# Patient Record
Sex: Male | Born: 1955 | Race: Black or African American | Hispanic: No | Marital: Single | Smoking: Former smoker
Health system: Southern US, Community
[De-identification: ages and names within clinical notes are randomized; demographics above are authoritative.]

## PROBLEM LIST (undated history)

## (undated) DIAGNOSIS — Z789 Other specified health status: Secondary | ICD-10-CM

## (undated) HISTORY — DX: Other specified health status: Z78.9

## (undated) HISTORY — PX: SHOULDER ARTHROSCOPY: SHX128

## (undated) HISTORY — PX: APPENDECTOMY: SHX54

## (undated) HISTORY — PX: KNEE ARTHROSCOPY: SUR90

---

## 2016-02-04 ENCOUNTER — Ambulatory Visit (INDEPENDENT_AMBULATORY_CARE_PROVIDER_SITE_OTHER): Payer: BLUE CROSS/BLUE SHIELD | Admitting: Physician Assistant

## 2016-02-04 VITALS — BP 152/88 | HR 71 | Temp 99.6°F | Resp 17 | Ht 77.5 in | Wt 260.0 lb

## 2016-02-04 DIAGNOSIS — D72828 Other elevated white blood cell count: Secondary | ICD-10-CM | POA: Diagnosis not present

## 2016-02-04 DIAGNOSIS — J029 Acute pharyngitis, unspecified: Secondary | ICD-10-CM | POA: Diagnosis not present

## 2016-02-04 LAB — POCT CBC
GRANULOCYTE PERCENT: 79.2 % (ref 37–80)
HCT, POC: 38.5 % — AB (ref 43.5–53.7)
HEMOGLOBIN: 13.2 g/dL — AB (ref 14.1–18.1)
LYMPH, POC: 1.5 (ref 0.6–3.4)
MCH, POC: 31.1 pg (ref 27–31.2)
MCHC: 34.3 g/dL (ref 31.8–35.4)
MCV: 90.6 fL (ref 80–97)
MID (cbc): 0.5 (ref 0–0.9)
MPV: 7.2 fL (ref 0–99.8)
POC Granulocyte: 7.8 — AB (ref 2–6.9)
POC LYMPH %: 15.6 % (ref 10–50)
POC MID %: 5.2 %M (ref 0–12)
Platelet Count, POC: 190 10*3/uL (ref 142–424)
RBC: 4.25 M/uL — AB (ref 4.69–6.13)
RDW, POC: 14.2 %
WBC: 9.8 10*3/uL (ref 4.6–10.2)

## 2016-02-04 LAB — POCT RAPID STREP A (OFFICE): RAPID STREP A SCREEN: NEGATIVE

## 2016-02-04 MED ORDER — AMOXICILLIN-POT CLAVULANATE 125-31.25 MG/5ML PO SUSR: 875.0000 mg | Freq: Two times a day (BID) | ORAL | 0 refills | Status: DC

## 2016-02-04 NOTE — Progress Notes (Signed)
02/04/2016 11:10 AM   DOB: January 18, 1956 / MRN: [id number]  SUBJECTIVE:  Bryce Perez is a 60 y.o. male presenting for left sided sore throat that started 3 days ago.  States that his throat hurts so bad that he can not swallow.  He associates HA and he denies cough.  He has been taking azithromycin and this has not helped.  Denies cough and fever.    He has no allergies on file.   He  has no past medical history on file.    He  reports that he has been smoking.  He has never used smokeless tobacco. He reports that he does not drink alcohol or use drugs. He  reports that he does not engage in sexual activity. The patient  has no past surgical history on file.  His family history is not on file.  Review of Systems  Constitutional: Negative for chills and fever.  HENT: Positive for sore throat. Negative for ear pain.   Respiratory: Negative for cough.   Cardiovascular: Negative for chest pain.  Genitourinary: Negative for dysuria.  Musculoskeletal: Negative for myalgias.  Skin: Negative for itching and rash.  Neurological: Positive for headaches. Negative for dizziness.    The problem list and medications were reviewed and updated by myself where necessary and exist elsewhere in the encounter.   OBJECTIVE:  BP (!) 152/88 (BP Location: Right Arm, Patient Position: Sitting, Cuff Size: Normal)   Pulse 71   Temp 99.6 F (37.6 C) (Oral)   Resp 17   Ht 6' 5.5" (1.969 m)   Wt 260 lb (117.9 kg)   SpO2 99%   BMI 30.43 kg/m   Physical Exam  Constitutional: He is oriented to person, place, and time. He appears well-developed and well-nourished. He appears ill. No distress.  HENT:  Mouth/Throat:    Cardiovascular: Normal rate and regular rhythm.   Pulmonary/Chest: Effort normal and breath sounds normal.  Musculoskeletal: Normal range of motion.  Lymphadenopathy:       Head (left side): Tonsillar adenopathy present.  Neurological: He is alert and oriented to person, place, and time.   Skin: Skin is warm and dry. He is not diaphoretic.  Psychiatric: He has a normal mood and affect.    Results for orders placed or performed in visit on 02/04/16 (from the past 72 hour(s))  POCT rapid strep A     Status: Normal   Collection Time: 02/04/16 10:47 AM  Result Value Ref Range   Rapid Strep A Screen Negative Negative  POCT CBC     Status: Abnormal   Collection Time: 02/04/16 10:54 AM  Result Value Ref Range   WBC 9.8 4.6 - 10.2 K/uL   Lymph, poc 1.5 0.6 - 3.4   POC LYMPH PERCENT 15.6 10 - 50 %L   MID (cbc) 0.5 0 - 0.9   POC MID % 5.2 0 - 12 %M   POC Granulocyte 7.8 (A) 2 - 6.9   Granulocyte percent 79.2 37 - 80 %G   RBC 4.25 (A) 4.69 - 6.13 M/uL   Hemoglobin 13.2 (A) 14.1 - 18.1 g/dL   HCT, POC 40.9 (A) 81.1 - 53.7 %   MCV 90.6 80 - 97 fL   MCH, POC 31.1 27 - 31.2 pg   MCHC 34.3 31.8 - 35.4 g/dL   RDW, POC 91.4 %   Platelet Count, POC 190 142 - 424 K/uL   MPV 7.2 0 - 99.8 fL    No results found.  ASSESSMENT  AND PLAN  Sherilyn CooterHenry was seen today for dysphagia and ear pain.  Diagnoses and all orders for this visit:  Sore throat: I have observed that he can swallow.  He does have some swelling in the back of his throat.  This is likely a strep infection but a retropharyngeal abscess, though less likely, remains of the differential though I would expect him having difficulty with secretions. Will start augmentin today in liquid form.  Advised if he is worse tomorrow to go directly to the ED, RTC on Monday if not improved.  -     POCT rapid strep A -     Culture, Group A Strep -     POCT CBC  Granulocytosis:  -     amoxicillin-clavulanate (AUGMENTIN) 125-31.25 MG/5ML suspension; Take 35 mLs (875 mg total) by mouth 2 (two) times daily.    The patient is advised to call or return to clinic if he does not see an improvement in symptoms, or to seek the care of the closest emergency department if he worsens with the above plan.   Deliah BostonMichael Raveen Wieseler, MHS, PA-C [facility] 02/04/2016 11:10 AM

## 2016-02-04 NOTE — Patient Instructions (Signed)
     IF you received an x-ray today, you will receive an invoice from [facility]. Please contact [facility] at [phone] with questions or concerns regarding your invoice.   IF you received labwork today, you will receive an invoice from [facility]. Please contact [facility] at [phone] with questions or concerns regarding your invoice.   Our billing staff will not be able to assist you with questions regarding bills from these companies.  You will be contacted with the lab results as soon as they are available. The fastest way to get your results is to activate your My Chart account. Instructions are located on the last page of this paperwork. If you have not heard from us regarding the results in 2 weeks, please contact this office.      

## 2016-02-05 LAB — CULTURE, GROUP A STREP: ORGANISM ID, BACTERIA: NORMAL

## 2016-02-06 ENCOUNTER — Encounter (HOSPITAL_BASED_OUTPATIENT_CLINIC_OR_DEPARTMENT_OTHER): Payer: Self-pay

## 2016-02-06 ENCOUNTER — Ambulatory Visit: Payer: Self-pay

## 2016-02-06 ENCOUNTER — Emergency Department (HOSPITAL_BASED_OUTPATIENT_CLINIC_OR_DEPARTMENT_OTHER): Payer: BLUE CROSS/BLUE SHIELD

## 2016-02-06 ENCOUNTER — Emergency Department (HOSPITAL_BASED_OUTPATIENT_CLINIC_OR_DEPARTMENT_OTHER)
Admission: EM | Admit: 2016-02-06 | Discharge: 2016-02-06 | Disposition: A | Discharge status: Home / Self Care | Payer: BLUE CROSS/BLUE SHIELD | Attending: Emergency Medicine | Admitting: Emergency Medicine

## 2016-02-06 DIAGNOSIS — Z87891 Personal history of nicotine dependence: Secondary | ICD-10-CM | POA: Diagnosis not present

## 2016-02-06 DIAGNOSIS — Z792 Long term (current) use of antibiotics: Secondary | ICD-10-CM | POA: Insufficient documentation

## 2016-02-06 DIAGNOSIS — J36 Peritonsillar abscess: Secondary | ICD-10-CM | POA: Insufficient documentation

## 2016-02-06 DIAGNOSIS — J029 Acute pharyngitis, unspecified: Secondary | ICD-10-CM | POA: Diagnosis present

## 2016-02-06 LAB — BASIC METABOLIC PANEL
Anion gap: 10 (ref 5–15)
BUN: 13 mg/dL (ref 6–20)
CHLORIDE: 103 mmol/L (ref 101–111)
CO2: 24 mmol/L (ref 22–32)
Calcium: 9.3 mg/dL (ref 8.9–10.3)
Creatinine, Ser: 0.88 mg/dL (ref 0.61–1.24)
GFR calc Af Amer: >60 mL/min (ref >60)
GFR calc non Af Amer: >60 mL/min (ref >60)
Glucose, Bld: 108 mg/dL — ABNORMAL HIGH (ref 65–99)
POTASSIUM: 3.5 mmol/L (ref 3.5–5.1)
SODIUM: 137 mmol/L (ref 135–145)

## 2016-02-06 LAB — CBC WITH DIFFERENTIAL/PLATELET
BASOS ABS: 0 10*3/uL (ref 0.0–0.1)
Basophils Relative: 0 %
EOS ABS: 0.1 10*3/uL (ref 0.0–0.7)
EOS PCT: 1 %
HCT: 41.9 % (ref 39.0–52.0)
Hemoglobin: 14.2 g/dL (ref 13.0–17.0)
Lymphocytes Relative: 13 %
Lymphs Abs: 1.4 10*3/uL (ref 0.7–4.0)
MCH: 30.9 pg (ref 26.0–34.0)
MCHC: 33.9 g/dL (ref 30.0–36.0)
MCV: 91.1 fL (ref 78.0–100.0)
Monocytes Absolute: 0.8 10*3/uL (ref 0.1–1.0)
Monocytes Relative: 8 %
Neutro Abs: 8.3 10*3/uL — ABNORMAL HIGH (ref 1.7–7.7)
Neutrophils Relative %: 78 %
PLATELETS: 227 10*3/uL (ref 150–400)
RBC: 4.6 MIL/uL (ref 4.22–5.81)
RDW: 12.8 % (ref 11.5–15.5)
WBC: 10.5 10*3/uL (ref 4.0–10.5)

## 2016-02-06 MED ORDER — ONDANSETRON HCL 4 MG/2ML IJ SOLN
4.0000 mg | Freq: Once | INTRAMUSCULAR | Status: AC
  Administered 2016-02-06: 4 mg via INTRAVENOUS
  Filled 2016-02-06: qty 2

## 2016-02-06 MED ORDER — OXYCODONE-ACETAMINOPHEN 5-325 MG PO TABS: 1.0000 | ORAL_TABLET | Freq: Four times a day (QID) | ORAL | 0 refills | Status: DC | PRN

## 2016-02-06 MED ORDER — SODIUM CHLORIDE 0.9 % IV BOLUS (SEPSIS)
1000.0000 mL | Freq: Once | INTRAVENOUS | Status: AC
  Administered 2016-02-06: 1000 mL via INTRAVENOUS

## 2016-02-06 MED ORDER — CLINDAMYCIN PHOSPHATE 600 MG/50ML IV SOLN
600.0000 mg | Freq: Once | INTRAVENOUS | Status: AC
  Administered 2016-02-06: 600 mg via INTRAVENOUS
  Filled 2016-02-06: qty 50

## 2016-02-06 MED ORDER — CLINDAMYCIN HCL 150 MG PO CAPS: 300.0000 mg | ORAL_CAPSULE | Freq: Three times a day (TID) | ORAL | 0 refills | Status: DC

## 2016-02-06 MED ORDER — BENZOCAINE 20 % MT AERO
INHALATION_SPRAY | Freq: Once | OROMUCOSAL | Status: AC
  Administered 2016-02-06: 1 via OROMUCOSAL

## 2016-02-06 MED ORDER — MORPHINE SULFATE (PF) 4 MG/ML IV SOLN
4.0000 mg | Freq: Once | INTRAVENOUS | Status: AC
Start: 2016-02-06 — End: 2016-02-06
  Administered 2016-02-06: 4 mg via INTRAVENOUS
  Filled 2016-02-06: qty 1

## 2016-02-06 MED ORDER — IOPAMIDOL (ISOVUE-300) INJECTION 61%
100.0000 mL | Freq: Once | INTRAVENOUS | Status: AC | PRN
  Administered 2016-02-06: 100 mL via INTRAVENOUS

## 2016-02-06 MED ORDER — BENZOCAINE 20 % MT AERO
INHALATION_SPRAY | OROMUCOSAL | Status: AC
  Administered 2016-02-06: 1 via OROMUCOSAL
  Filled 2016-02-06: qty 57

## 2016-02-06 MED ORDER — DEXAMETHASONE SODIUM PHOSPHATE 10 MG/ML IJ SOLN
10.0000 mg | Freq: Once | INTRAMUSCULAR | Status: AC
  Administered 2016-02-06: 10 mg via INTRAVENOUS
  Filled 2016-02-06: qty 1

## 2016-02-06 NOTE — ED Triage Notes (Signed)
Patient reports was seen on 8/26 prescribed antibiotics for sore throat.  Reports unable to swallow due to pain and feels like his throat is closed.  Reports unable to swallow saliva.  Patient speaking minimally due to pain.

## 2016-02-06 NOTE — ED Provider Notes (Signed)
[facility] Provider Note   CSN: [id number] Arrival date & time: 02/06/16  0223     History   Chief Complaint Chief Complaint  Patient presents with  . Oral Swelling    HPI Bryce Perez is a 60 y.o. male.  HPI  This is a 60 year old male who presents with worsening sore throat and swelling. Patient was seen and evaluated 2 days ago for sore throat. At that time strep screen was negative. He was given Augmentin. He is currently taking Augmentin. He reports pain that is worse with swallowing. He feels like he is more swollen. Reports chills without documented fevers. Currently rates his pain at 10 out of 10. He is not taking anything for the pain.  History reviewed. No pertinent past medical history.  There are no active problems to display for this patient.   Past Surgical History:  Procedure Laterality Date  . APPENDECTOMY    . KNEE ARTHROSCOPY    . SHOULDER ARTHROSCOPY         Home Medications    Prior to Admission medications   Medication Sig Start Date End Date Taking? Authorizing Provider  amoxicillin-clavulanate (AUGMENTIN) 125-31.25 MG/5ML suspension Take 35 mLs (875 mg total) by mouth 2 (two) times daily. 02/04/16  Yes Ofilia NeasMichael L Clark, PA-C  clindamycin (CLEOCIN) 150 MG capsule Take 2 capsules (300 mg total) by mouth 3 (three) times daily. 02/06/16   Shon Batonourtney F Marylynn Rigdon, MD  oxyCODONE-acetaminophen (PERCOCET/ROXICET) 5-325 MG tablet Take 1 tablet by mouth every 6 (six) hours as needed for severe pain. 02/06/16   Shon Batonourtney F Melroy Bougher, MD    Family History No family history on file.  Social History Social History  Substance Use Topics  . Smoking status: Former Games developermoker  . Smokeless tobacco: Never Used  . Alcohol use Yes     Allergies   Review of patient's allergies indicates no known allergies.   Review of Systems Review of Systems  Constitutional: Positive for chills. Negative for fever.  HENT: Positive for sore throat and trouble swallowing.  Negative for drooling.   Cardiovascular: Negative for chest pain.  Gastrointestinal: Negative for abdominal pain.  All other systems reviewed and are negative.    Physical Exam Updated Vital Signs BP 137/90 (BP Location: Right Arm)   Pulse 94   Temp 98.7 F (37.1 C) (Oral)   Resp 19   Ht 6' 5.5" (1.969 m)   Wt 260 lb (117.9 kg)   SpO2 97%   BMI 30.43 kg/m   Physical Exam  Constitutional: He is oriented to person, place, and time. He appears well-developed and well-nourished.  HENT:  Head: Normocephalic and atraumatic.  Diffusely edematous uvula, midline, asymmetric swelling of the left tonsil with erythema of the posterior oropharynx, no significant drooling, trismus, stridor  Neck: Neck supple.  Cardiovascular: Regular rhythm and normal heart sounds.   No murmur heard. Tachycardia  Pulmonary/Chest: Effort normal and breath sounds normal. No respiratory distress. He has no wheezes.  Abdominal: Soft. Bowel sounds are normal. There is no tenderness. There is no rebound.  Musculoskeletal: He exhibits no edema.  Lymphadenopathy:    He has cervical adenopathy.  Neurological: He is alert and oriented to person, place, and time.  Skin: Skin is warm and dry.  Psychiatric: He has a normal mood and affect.  Nursing note and vitals reviewed.    ED Treatments / Results  Labs (all labs ordered are listed, but only abnormal results are displayed) Labs Reviewed  BASIC METABOLIC PANEL -  Abnormal; Notable for the following:       Result Value   Glucose, Bld 108 (*)    All other components within normal limits  CBC WITH DIFFERENTIAL/PLATELET - Abnormal; Notable for the following:    Neutro Abs 8.3 (*)    All other components within normal limits    EKG  EKG Interpretation None       Radiology Ct Soft Tissue Neck W Contrast  Result Date: 02/06/2016 CLINICAL DATA:  Sore throat, unable to swallow. Swollen LEFT tonsil. EXAM: CT NECK WITH CONTRAST TECHNIQUE: Multidetector CT  imaging of the neck was performed using the standard protocol following the bolus administration of intravenous contrast. CONTRAST:  ISOVUE-300 IOPAMIDOL (ISOVUE-300) INJECTION 61% COMPARISON:  None. FINDINGS: Pharynx and larynx: LEFT tonsillar enlargement with 13 x 13 x 19 mm (transverse by AP by CC) LEFT tonsillar abscess. Stranding of the LEFT parapharyngeal fat. Local effusion extending to the hypopharynx. Apposition the true vocal cords suggests phonation, the laryngeal edema. Small retropharyngeal effusion. Salivary glands: The by the lateral submandibular glands are mildly enlarged with ductal dilatation to the floor of mouth. Punctate LEFT submandibular sialolith. No major salivary gland mass. Parotid glands are nonacute. Thyroid: Normal. Lymph nodes: 10 mm short access LEFT level IIa lymph node. Additional slightly smaller bilateral level IIa and IIb lymph nodes. 7 mm LEFT lateral pharyngeal lymph node. Vascular: Normal. Limited intracranial: Normal. Visualized orbits: Normal. Mastoids and visualized paranasal sinuses: Well-aerated. Skeleton: No destructive bony lesions. Moderate C4-5 through C6-7 disc height loss. C3-4 thru C6-7 uncovertebral hypertrophy with severe neural foraminal narrowing C3-4 thru C6-7. Upper chest: Lung apices are clear. No superior mediastinal lymphadenopathy. IMPRESSION: LEFT tonsillitis and 13 x 13 x 19 mm LEFT peritonsillar abscess. Small retropharyngeal effusion. Mild lymphadenopathy is likely reactive though presence of LEFT lateral pharyngeal lymph node is suspicious and, recommend follow-up to exclude underlying head and neck cancer. 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 Electronically Signed   By: Awilda Metro M.D.   On: 02/06/2016 05:03    Procedures .Marland KitchenIncision and Drainage Date/Time: 02/06/2016 5:42 AM Performed by: Shon Baton Authorized by:  Ross Marcus F   Consent:    Consent obtained:  Verbal   Consent given by:  Patient   Risks discussed:  Bleeding, pain and damage to other organs   Alternatives discussed:  No treatment, delayed treatment and observation Location:    Indications for incision and drainage: peritonsillar abscess.   Size:  1.3x1.3x1.9 cm   Location:  Head   Head/neck location: left tonsil. Anesthesia (see MAR for exact dosages):    Anesthesia method:  Topical application   Topical anesthesia: benzocaine spray. Procedure type:    Complexity:  Simple Procedure details:    Needle aspiration: yes     Needle size:  18 G   Incision types:  Stab incision   Drainage:  Purulent   Drainage amount:  Moderate (3 cc)   Packing materials:  None Comments:     Minimal bleeding noted, airway continues to be intact   (including critical care time)  Medications Ordered in ED Medications  dexamethasone (DECADRON) injection 10 mg (10 mg Intravenous Given 02/06/16 0314)  morphine 4 MG/ML injection 4 mg (4 mg Intravenous Given 02/06/16 0314)  ondansetron (ZOFRAN) injection 4 mg (4 mg Intravenous Given 02/06/16 0314)  clindamycin (CLEOCIN) IVPB 600 mg (0 mg Intravenous Stopped 02/06/16 0345)  sodium chloride 0.9 % bolus 1,000 mL (0  mLs Intravenous Stopped 02/06/16 0535)  iopamidol (ISOVUE-300) 61 % injection 100 mL (100 mLs Intravenous Contrast Given 02/06/16 0428)  Benzocaine (HURRCAINE) 20 % mouth spray (1 application Mouth/Throat Given 02/06/16 0530)     Initial Impression / Assessment and Plan / ED Course  I have reviewed the triage vital signs and the nursing notes.  Pertinent labs & imaging results that were available during my care of the patient were reviewed by me and considered in my medical decision making (see chart for details).  Clinical Course    Patient presents with worsening sore throat and trouble swallowing. Nontoxic. Initially afebrile but became febrile. Left greater than right tonsillar  swelling suspicious for peritonsillar abscess. He also has diffuse uvular swelling. No airway compromise. Patient was given pain and nausea medication. He is also given fluids and Decadron. IV clindamycin ordered. CT scan confirms tonsillitis and a left peritonsillar abscess. This is drained without complication at the bedside. Patient was observed. No significant bleeding or complications noted. Will discharge with clindamycin. Patient instructed to discontinue Augmentin. Follow-up with Dr. Suszanne Conners.  After history, exam, and medical workup I feel the patient has been appropriately medically screened and is safe for discharge home. Pertinent diagnoses were discussed with the patient. Patient was given return precautions.   Final Clinical Impressions(s) / ED Diagnoses   Final diagnoses:  Peritonsillar abscess    New Prescriptions New Prescriptions   CLINDAMYCIN (CLEOCIN) 150 MG CAPSULE    Take 2 capsules (300 mg total) by mouth 3 (three) times daily.   OXYCODONE-ACETAMINOPHEN (PERCOCET/ROXICET) 5-325 MG TABLET    Take 1 tablet by mouth every 6 (six) hours as needed for severe pain.     Shon Baton, MD 02/06/16 [phone]

## 2016-05-24 ENCOUNTER — Encounter: Payer: Self-pay | Admitting: Family Medicine

## 2016-05-24 ENCOUNTER — Ambulatory Visit (INDEPENDENT_AMBULATORY_CARE_PROVIDER_SITE_OTHER): Payer: BLUE CROSS/BLUE SHIELD | Admitting: Family Medicine

## 2016-05-24 VITALS — BP 166/100 | HR 68 | Temp 98.1°F | Ht 77.5 in | Wt 267.4 lb

## 2016-05-24 DIAGNOSIS — R358 Other polyuria: Secondary | ICD-10-CM

## 2016-05-24 DIAGNOSIS — Z114 Encounter for screening for human immunodeficiency virus [HIV]: Secondary | ICD-10-CM

## 2016-05-24 DIAGNOSIS — Z13220 Encounter for screening for lipoid disorders: Secondary | ICD-10-CM | POA: Diagnosis not present

## 2016-05-24 DIAGNOSIS — R030 Elevated blood-pressure reading, without diagnosis of hypertension: Secondary | ICD-10-CM

## 2016-05-24 DIAGNOSIS — R3589 Other polyuria: Secondary | ICD-10-CM

## 2016-05-24 DIAGNOSIS — L309 Dermatitis, unspecified: Secondary | ICD-10-CM | POA: Diagnosis not present

## 2016-05-24 LAB — COMPREHENSIVE METABOLIC PANEL
ALBUMIN: 4.5 g/dL (ref 3.5–5.2)
ALT: 20 U/L (ref 0–53)
AST: 19 U/L (ref 0–37)
Alkaline Phosphatase: 75 U/L (ref 39–117)
BILIRUBIN TOTAL: 0.4 mg/dL (ref 0.2–1.2)
BUN: 15 mg/dL (ref 6–23)
CALCIUM: 9.2 mg/dL (ref 8.4–10.5)
CO2: 32 meq/L (ref 19–32)
CREATININE: 1.01 mg/dL (ref 0.40–1.50)
Chloride: 106 mEq/L (ref 96–112)
GFR: 97.18 mL/min (ref >60.00)
Glucose, Bld: 100 mg/dL — ABNORMAL HIGH (ref 70–99)
Potassium: 4.1 mEq/L (ref 3.5–5.1)
SODIUM: 142 meq/L (ref 135–145)
Total Protein: 7.2 g/dL (ref 6.0–8.3)

## 2016-05-24 LAB — LIPID PANEL
CHOLESTEROL: 150 mg/dL (ref 0–200)
HDL: 46.2 mg/dL (ref >39.00)
LDL Cholesterol: 90 mg/dL (ref 0–99)
NonHDL: 104.28
TRIGLYCERIDES: 72 mg/dL (ref 0.0–149.0)
Total CHOL/HDL Ratio: 3
VLDL: 14.4 mg/dL (ref 0.0–40.0)

## 2016-05-24 LAB — POC URINALSYSI DIPSTICK (AUTOMATED)
BILIRUBIN UA: NEGATIVE
Glucose, UA: NEGATIVE
KETONES UA: NEGATIVE
Leukocytes, UA: NEGATIVE
Nitrite, UA: NEGATIVE
PROTEIN UA: NEGATIVE
Urobilinogen, UA: 0.2
pH, UA: 6

## 2016-05-24 LAB — HEMOGLOBIN A1C: HEMOGLOBIN A1C: 5.9 % (ref 4.6–6.5)

## 2016-05-24 NOTE — Progress Notes (Signed)
Chief Complaint  Patient presents with  . Establish Care    pt want to discuss dry skin on(B) legs,pain in (B) hips,freq urination x 2 years,and ha's       New Patient Visit SUBJECTIVE: HPI: Bryce Perez is an 60 y.o.male who is being seen for establishing care.  The patient was previously seen at an office in [place].  Frequent urination 2 years of frequent urination.  Getting worse. No pain, bleeding, nocturia, incontinence.  Doesn't have as strong a stream as when he was younger. Does admit to incomplete emptying. Will go to restroom 10x/day. No fam hx of DI.  Skin 2 months ago, started having a rash on his legs. Itchy, small and dark. They resolved with steroid cream, but once he stops using it, the itching returns. No new soaps, lotions, topicals or detergents. No recent travel, fevers, or sick contacts.  No Known Allergies  History reviewed. No pertinent past medical history. Past Surgical History:  Procedure Laterality Date  . APPENDECTOMY    . KNEE ARTHROSCOPY    . SHOULDER ARTHROSCOPY     Social History   Social History  . Marital status: Single   Social History Main Topics  . Smoking status: Former Games developermoker  . Smokeless tobacco: Never Used  . Alcohol use Yes  . Drug use: No  . Sexual activity: No   Family History  Problem Relation Age of Onset  . Cancer Father   . COPD Father   . Hypertension Father   . Glaucoma Father   . Seizures Daughter    Takes no medications routinely.  ROS Skin:  +itchy patches on legs  GU: As noted in HPI   OBJECTIVE: BP (!) 166/100 (BP Location: Left Arm, Patient Position: Sitting, Cuff Size: Large)   Pulse 68   Temp 98.1 F (36.7 C) (Oral)   Ht 6' 5.5" (1.969 m)   Wt 267 lb 6.4 oz (121.3 kg)   SpO2 98%   BMI 31.30 kg/m   Constitutional: -  VS reviewed -  Well developed, well nourished, appears stated age -  No apparent distress  Psychiatric: -  Oriented to person, place, and time -  Memory intact -   Affect and mood normal -  Fluent conversation, good eye contact -  Judgment and insight age appropriate  Eye: -  Conjunctivae clear, no discharge -  Pupils symmetric, round, reactive to light  ENMT: -  Oral mucosa without lesions, tongue and uvula midline    Tonsils not enlarged, no erythema, no exudate, trachea midline    Pharynx moist, no lesions, no erythema  Neck: -  No gross swelling, no palpable masses -  Thyroid midline, not enlarged, mobile, no palpable masses  Cardiovascular: -  RRR, no murmurs -  No bruits -  No LE edema  Respiratory: -  Normal respiratory effort, no accessory muscle use, no retraction -  Breath sounds equal, no wheezes, no ronchi, no crackles  Gastrointestinal: -  Bowel sounds normal -  No tenderness, no distention, no guarding, no masses  Neurological:  -  CN II - XII grossly intact -  Sensation grossly intact to light touch, equal bilaterally  Musculoskeletal: -  No clubbing, no cyanosis -  Gait normal  Skin: -  Hyperpigmented and discreet lesions on LE's b/l, the lesions are circular and approx 0.6 cm in diameter. There is no central punctation, erythema, drainage, or TTP. -  There is a larger hyperpigmented patch that is polygonal in shape and  6 x 7 cm in diameter. -  Warm and dry to palpation   ASSESSMENT/PLAN: Dermatitis  Polyuria - Plan: Comprehensive metabolic panel, Hemoglobin A1c, Osmolality, urine  Encounter for screening for HIV - Plan: HIV antibody  Screening cholesterol level - Plan: Lipid panel  Patient instructed to sign release of records form from his previous PCP. For skin, recommended continuing with steroid cream vs biopsy versus referral to dermatology. He opted for the biopsy at this time. We'll rule out metabolic causes of polyuria. This is likely due to BPH. Patient should return in 2 weeks to review labs, recheck BP and have a skin biopsy. The patient voiced understanding and agreement to the plan.   Jilda Rocheicholas Paul  [place], DO 05/24/16  8:16 AM

## 2016-05-24 NOTE — Progress Notes (Signed)
Pre visit review using our clinic review tool, if applicable. No additional management support is needed unless otherwise documented below in the visit note. 

## 2016-05-25 LAB — OSMOLALITY, URINE: Osmolality, Ur: 901 mOsm/kg (ref 50–1200)

## 2016-05-25 LAB — HIV ANTIBODY (ROUTINE TESTING W REFLEX): HIV 1&2 Ab, 4th Generation: NONREACTIVE

## 2016-06-08 ENCOUNTER — Telehealth: Payer: Self-pay | Admitting: Family Medicine

## 2016-06-08 NOTE — Telephone Encounter (Signed)
Patient called stating that as of January his insurance will be changing. He is curious to know how much the skin biopsy would cost as a self pay patient. Please advise.    Phone: [phone]

## 2016-06-08 NOTE — Telephone Encounter (Signed)
Can we get our billing department to answer this?

## 2016-06-12 NOTE — Telephone Encounter (Signed)
Per Price Inquiry, Biopsy of a single lesion is $261.  Each additional lesion is $89.00 each.  This is of course an approximation.

## 2016-06-12 NOTE — Telephone Encounter (Signed)
Please advise 

## 2016-06-12 NOTE — Telephone Encounter (Signed)
I have spoken to patient and informed him of information. TL/CMA

## 2016-06-13 ENCOUNTER — Ambulatory Visit: Payer: BLUE CROSS/BLUE SHIELD | Admitting: Family Medicine

## 2017-11-29 IMAGING — CT CT NECK W/ CM
4 series · 15 of 33 positions shown, 18 images · IV contrast (iopamidol)
Comparison: None.

CLINICAL DATA: Sore throat, unable to swallow. Swollen LEFT tonsil.

EXAM:
CT NECK WITH CONTRAST
TECHNIQUE: Multidetector CT imaging of the neck was performed using the
standard protocol following the bolus administration of intravenous
contrast.
CONTRAST:  100mL L21OQ6-OQQ IOPAMIDOL (L21OQ6-OQQ) INJECTION 61%

[Series 2: axial neck · axial · 0.57mm/px · z∈[+6,+164]mm · 5 of 119 slices shown, 7 images]
[im 20/119  soft-tissue]
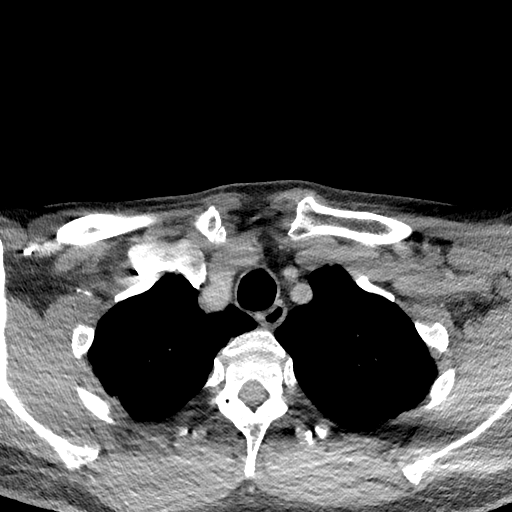
[im 20/119  bone]
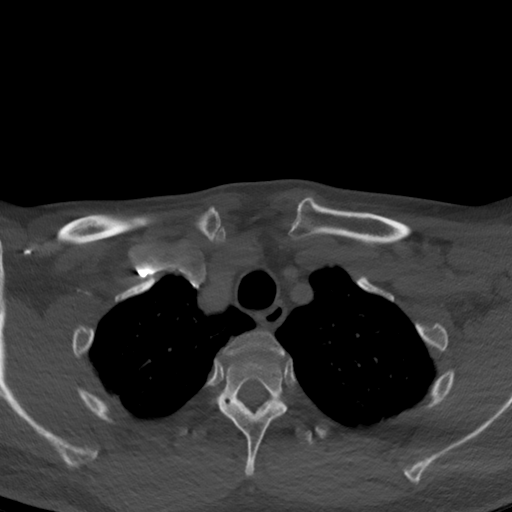
[im 40/119  bone]
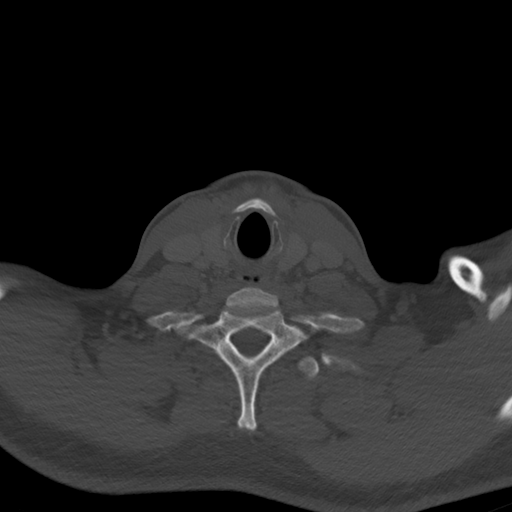
[im 60/119  bone]
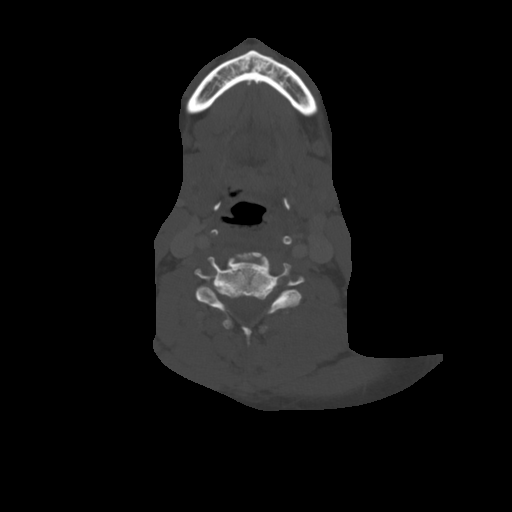
[im 79/119  bone]
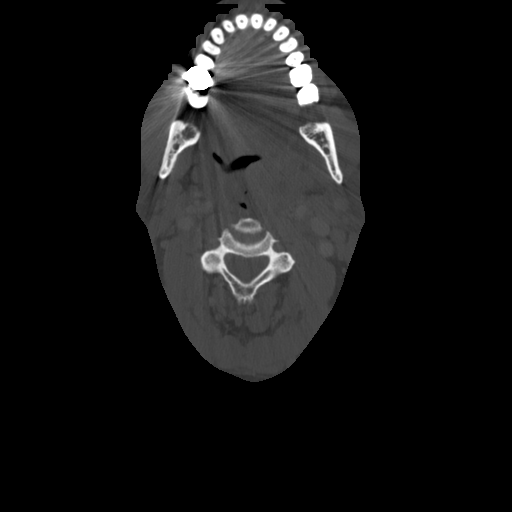
[im 99/119  soft-tissue]
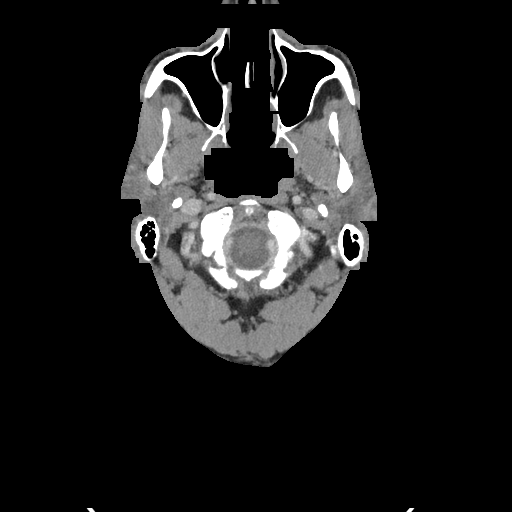
[im 99/119  bone]
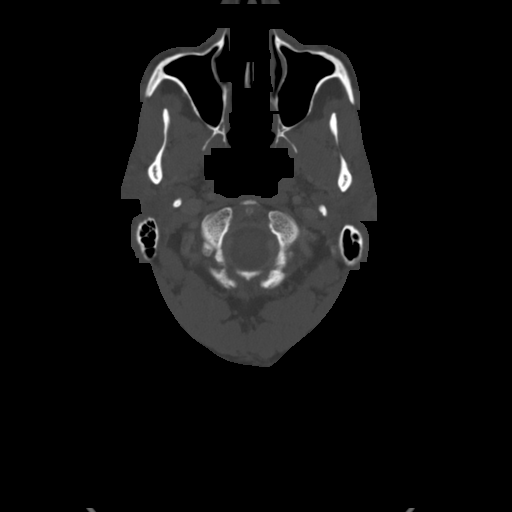

[Series 4: sag neck · sagittal · 0.48mm/px · 5 of 101 slices shown, 6 images]
[im 34/101  bone]
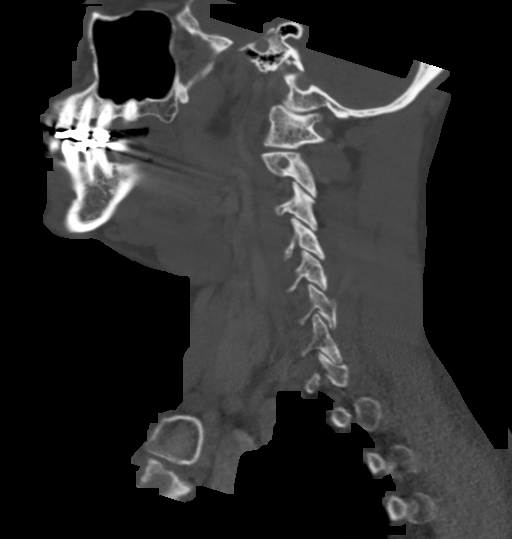
[im 42/101  bone]
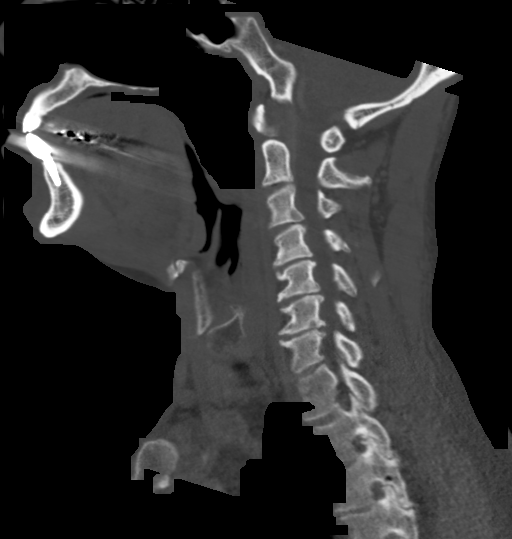
[im 51/101  soft-tissue]
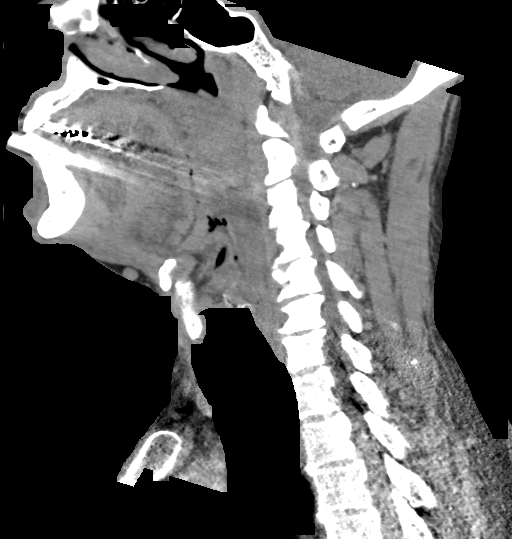
[im 51/101  bone]
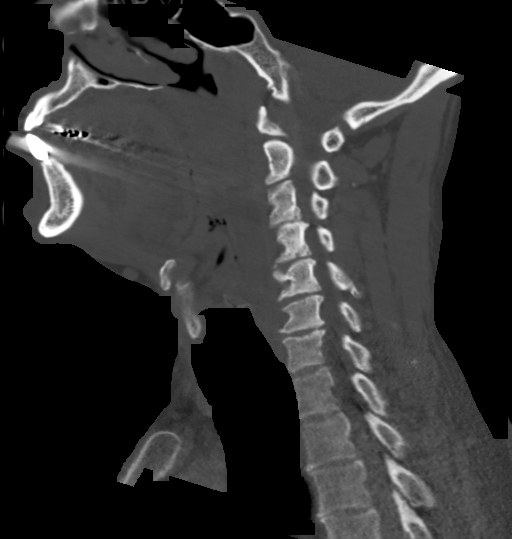
[im 59/101  bone]
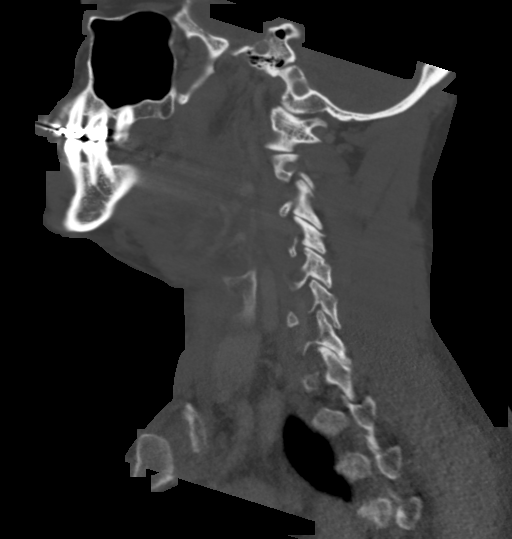
[im 67/101  bone]
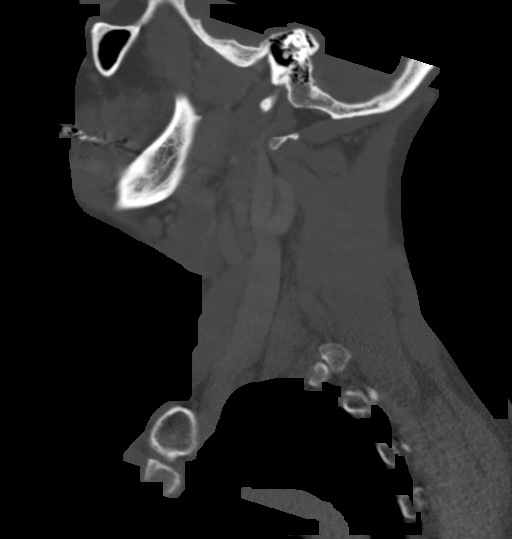

[Series 5: cor neck · coronal · 0.52mm/px · 3 of 126 slices shown]
[im 33/126  bone]
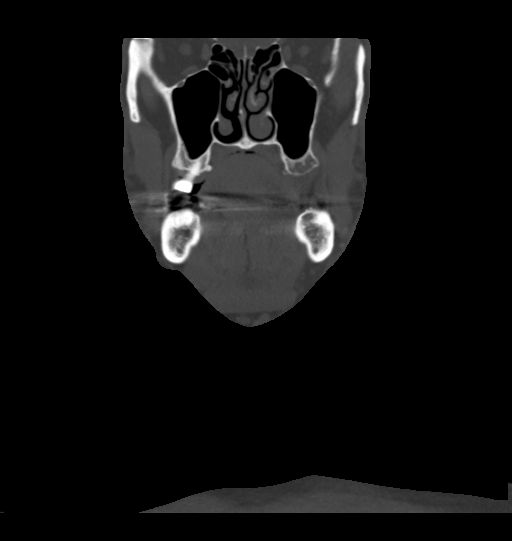
[im 53/126  bone]
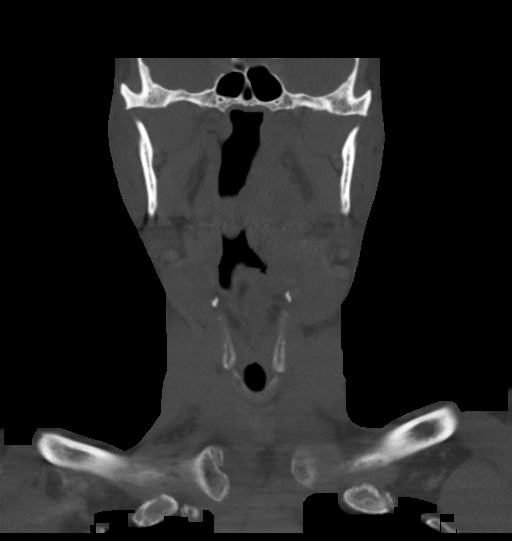
[im 74/126  bone]
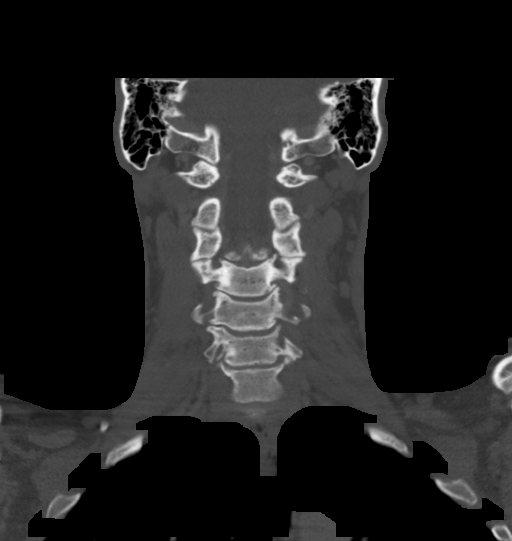

[Series 6: orthogonal ax · axial · 0.39mm/px · z∈[-38,+4]mm · 2 of 133 slices shown]
[im 23/133  bone]
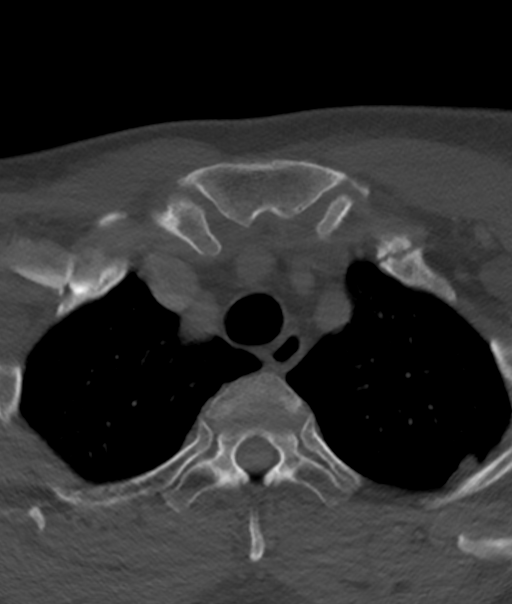
[im 45/133  bone]
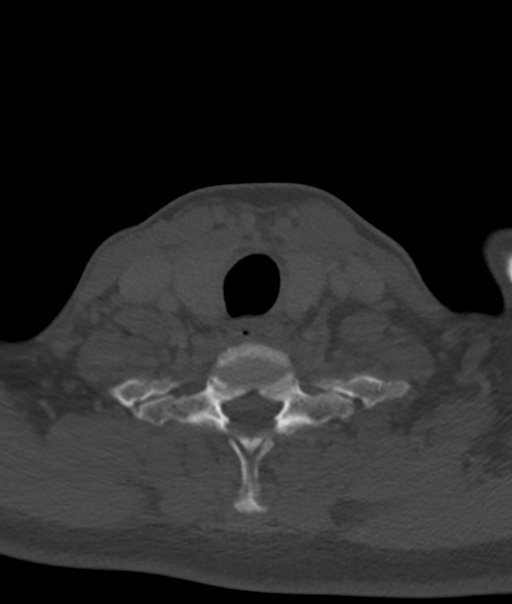

[15 of 33 positions shown; findings below may reference images not displayed]

FINDINGS: Pharynx and larynx: LEFT tonsillar enlargement with 13 x 13 x 19 mm
(transverse by AP by CC) LEFT tonsillar abscess. Stranding of the
LEFT parapharyngeal fat. Local effusion extending to the
hypopharynx. Apposition the true vocal cords suggests phonation, the
laryngeal edema. Small retropharyngeal effusion.

Salivary glands: The by the lateral submandibular glands are mildly
enlarged with ductal dilatation to the floor of mouth. Punctate LEFT
submandibular sialolith. No major salivary gland mass. Parotid
glands are nonacute.

Thyroid: Normal.

Lymph nodes: 10 mm short access LEFT level IIa lymph node.
Additional slightly smaller bilateral level IIa and IIb lymph nodes.
7 mm LEFT lateral pharyngeal lymph node.

Vascular: Normal.

Limited intracranial: Normal.

Visualized orbits: Normal.

Mastoids and visualized paranasal sinuses: Well-aerated.

Skeleton: No destructive bony lesions. Moderate C4-5 through C6-7
disc height loss. C3-4 thru C6-7 uncovertebral hypertrophy with
severe neural foraminal narrowing C3-4 thru C6-7.

Upper chest: Lung apices are clear. No superior mediastinal
lymphadenopathy.
IMPRESSION: LEFT tonsillitis and 13 x 13 x 19 mm LEFT peritonsillar abscess.

Small retropharyngeal effusion. Mild lymphadenopathy is likely
reactive though presence of LEFT lateral pharyngeal lymph node is
suspicious and, recommend follow-up to exclude underlying head and
neck cancer.

LEFT submandibular gland sialolith, with bilateral submandibular
gland ductal dilatation suggesting chronic sialoadenitis without
acute component.
